# Patient Record
Sex: Male | Born: 1980 | Race: White | Hispanic: No | Marital: Married | Smoking: Former smoker
Health system: Southern US, Community
[De-identification: ages and names within clinical notes are randomized; demographics above are authoritative.]

## PROBLEM LIST (undated history)

## (undated) DIAGNOSIS — Z87442 Personal history of urinary calculi: Secondary | ICD-10-CM

## (undated) DIAGNOSIS — E119 Type 2 diabetes mellitus without complications: Secondary | ICD-10-CM

## (undated) HISTORY — DX: Personal history of urinary calculi: Z87.442

## (undated) HISTORY — PX: OTHER SURGICAL HISTORY: SHX169

---

## 2006-12-25 ENCOUNTER — Ambulatory Visit: Payer: Self-pay | Admitting: Orthopaedic Surgery

## 2015-09-28 ENCOUNTER — Ambulatory Visit
Admission: RE | Admit: 2015-09-28 | Discharge: 2015-09-28 | Disposition: A | Discharge status: Home / Self Care | Payer: 59 | Source: Ambulatory Visit | Attending: Urology | Admitting: Urology

## 2015-09-28 ENCOUNTER — Ambulatory Visit (INDEPENDENT_AMBULATORY_CARE_PROVIDER_SITE_OTHER): Payer: 59 | Admitting: Urology

## 2015-09-28 ENCOUNTER — Encounter: Payer: Self-pay | Admitting: Urology

## 2015-09-28 ENCOUNTER — Ambulatory Visit (INDEPENDENT_AMBULATORY_CARE_PROVIDER_SITE_OTHER)
Admit: 2015-09-28 | Discharge: 2015-09-28 | Disposition: A | Discharge status: Home / Self Care | Payer: 59 | Attending: Family Medicine | Admitting: Family Medicine

## 2015-09-28 ENCOUNTER — Encounter: Payer: Self-pay | Admitting: *Deleted

## 2015-09-28 ENCOUNTER — Ambulatory Visit
Admission: EM | Admit: 2015-09-28 | Discharge: 2015-09-28 | Disposition: A | Discharge status: Home / Self Care | Payer: 59 | Attending: Family Medicine | Admitting: Family Medicine

## 2015-09-28 VITALS — BP 142/82 | HR 85 | Ht 73.0 in | Wt 333.0 lb

## 2015-09-28 DIAGNOSIS — J302 Other seasonal allergic rhinitis: Secondary | ICD-10-CM | POA: Insufficient documentation

## 2015-09-28 DIAGNOSIS — N1330 Unspecified hydronephrosis: Secondary | ICD-10-CM

## 2015-09-28 DIAGNOSIS — N132 Hydronephrosis with renal and ureteral calculous obstruction: Secondary | ICD-10-CM | POA: Diagnosis not present

## 2015-09-28 DIAGNOSIS — N200 Calculus of kidney: Secondary | ICD-10-CM | POA: Diagnosis not present

## 2015-09-28 DIAGNOSIS — E669 Obesity, unspecified: Secondary | ICD-10-CM | POA: Insufficient documentation

## 2015-09-28 DIAGNOSIS — R3129 Other microscopic hematuria: Secondary | ICD-10-CM

## 2015-09-28 DIAGNOSIS — N201 Calculus of ureter: Secondary | ICD-10-CM | POA: Diagnosis not present

## 2015-09-28 LAB — URINALYSIS COMPLETE WITH MICROSCOPIC (ARMC ONLY)
BILIRUBIN URINE: NEGATIVE
Bacteria, UA: NONE SEEN
GLUCOSE, UA: NEGATIVE mg/dL
LEUKOCYTES UA: NEGATIVE
NITRITE: NEGATIVE
PH: 6 (ref 5.0–8.0)
Protein, ur: 100 mg/dL — AB
SPECIFIC GRAVITY, URINE: 1.025 (ref 1.005–1.030)
WBC, UA: NONE SEEN WBC/hpf (ref 0–5)

## 2015-09-28 LAB — URINALYSIS, COMPLETE
Bilirubin, UA: NEGATIVE
Glucose, UA: NEGATIVE
Ketones, UA: NEGATIVE
NITRITE UA: NEGATIVE
PH UA: 5.5 (ref 5.0–7.5)
Specific Gravity, UA: 1.025 (ref 1.005–1.030)
UUROB: 0.2 mg/dL (ref 0.2–1.0)

## 2015-09-28 LAB — MICROSCOPIC EXAMINATION: Bacteria, UA: NONE SEEN

## 2015-09-28 MED ORDER — KETOROLAC TROMETHAMINE 60 MG/2ML IM SOLN
60.0000 mg | Freq: Once | INTRAMUSCULAR | Status: AC
  Administered 2015-09-28: 60 mg via INTRAMUSCULAR

## 2015-09-28 MED ORDER — ONDANSETRON 8 MG PO TBDP: 8.0000 mg | ORAL_TABLET | Freq: Three times a day (TID) | ORAL | Status: DC | PRN

## 2015-09-28 MED ORDER — KETOROLAC TROMETHAMINE 10 MG PO TABS: 10.0000 mg | ORAL_TABLET | Freq: Four times a day (QID) | ORAL | Status: AC | PRN

## 2015-09-28 MED ORDER — NAPROXEN 500 MG PO TABS
500.0000 mg | ORAL_TABLET | Freq: Two times a day (BID) | ORAL | Status: AC
Start: 2015-09-28 — End: ?

## 2015-09-28 MED ORDER — ONDANSETRON 8 MG PO TBDP
8.0000 mg | ORAL_TABLET | Freq: Once | ORAL | Status: AC
  Administered 2015-09-28: 8 mg via ORAL

## 2015-09-28 MED ORDER — TAMSULOSIN HCL 0.4 MG PO CAPS: 0.4000 mg | ORAL_CAPSULE | Freq: Every day | ORAL | Status: DC

## 2015-09-28 MED ORDER — HYDROCODONE-ACETAMINOPHEN 5-325 MG PO TABS: 1.0000 | ORAL_TABLET | Freq: Three times a day (TID) | ORAL | Status: AC | PRN

## 2015-09-28 NOTE — Discharge Instructions (Signed)
Hydronephrosis °Hydronephrosis is the enlargement of a kidney due to a blockage that stops urine from flowing out of the body. °CAUSES °Common causes of this condition include: °· A birth (congenital) defect of the kidney. °· A congenital defect of the tube through which urine travels (ureter). °· Kidney stones. °· An enlarged prostate gland. °· A tumor. °· Cancer of the prostate, bladder, uterus, ovary, or colon. °· A blood clot. °SYMPTOMS °Symptoms of this condition include: °· Pain or discomfort in your side (flank). °· Swelling of the abdomen. °· Pain in the abdomen. °· Nausea and vomiting. °· Fever. °· Pain while passing urine. °· Feeling of urgency to urinate. °· Frequent urination. °· Infection of the urinary tract. °In some cases, there are no symptoms. °DIAGNOSIS °This condition Hukill be diagnosed with: °· A medical history. °· A physical exam. °· Blood and urine tests to check kidney function. °· Imaging tests, such as an X-ray, ultrasound, CT scan, or MRI. °· A test in which a rigid or flexible telescope (cystoscope) is used to view the site of the blockage. °TREATMENT °Treatment for this condition depends on where the blockage is located, how long it has been there, and what caused it. The goal of treatment is to remove the blockage. Treatment options include: °· A procedure to put in a soft tube to help drain urine. °· Antibiotic medicines to treat or prevent infection. °· Shock-wave therapy (lithotripsy) to help eliminate kidney stones. °HOME CARE INSTRUCTIONS °· Get lots of rest. °· Drink enough fluid to keep your urine clear or pale yellow. °· If you have a drain in, follow your health care provider's instructions about how to care for it. °· Take medicines only as directed by your health care provider. °· If you were prescribed an antibiotic medicine, finish all of it even if you start to feel better. °· Keep all follow-up visits as directed by your health care provider. This is important. °SEEK  MEDICAL CARE IF: °· You continue to have symptoms after treatment. °· You develop new symptoms. °· You have a problem with a drainage device. °· Your urine becomes cloudy or bloody. °· You have a fever. °SEEK IMMEDIATE MEDICAL CARE IF: °· You have severe flank or abdominal pain. °· You develop vomiting and are unable to keep fluids down. °  °This information is not intended to replace advice given to you by your health care provider. Make sure you discuss any questions you have with your health care provider. °  °Document Released: 03/19/2007 Document Revised: 10/06/2014 Document Reviewed: 05/18/2014 °Elsevier Interactive Patient Education ©2016 [employer] ° °Kidney Stones °Kidney stones (urolithiasis) are solid masses that form inside your kidneys. The intense pain is caused by the stone moving through the kidney, ureter, bladder, and urethra (urinary tract). When the stone moves, the ureter starts to spasm around the stone. The stone is usually passed in your pee (urine).  °HOME CARE °· Drink enough fluids to keep your pee clear or pale yellow. This helps to get the stone out. °· Take a 24-hour pee (urine) sample as told by your doctor. You [place] need to take another sample every 6-12 months. °· Strain all pee through the provided strainer. Do not pee without peeing through the strainer, not even once. If you pee the stone out, catch it in the strainer. The stone Sc[place] be as small as a grain of salt. Take this to your doctor. This will help your doctor figure out what you can do   to try to prevent more kidney stones. °· Only take medicine as told by your doctor. °· Make changes to your daily diet as told by your doctor. You [place] be told to: °¨ Limit how much salt you eat. °¨ Eat 5 or more servings of fruits and vegetables each day. °¨ Limit how much meat, poultry, fish, and eggs you eat. °· Keep all follow-up visits as told by your doctor. This is important. °· Get follow-up X-rays as told by your doctor. °GET HELP  IF: °You have pain that gets worse even if you have been taking pain medicine. °GET HELP RIGHT AWAY IF:  °· Your pain does not get better with medicine. °· You have a fever or shaking chills. °· Your pain increases and gets worse over 18 hours. °· You have new belly (abdominal) pain. °· You feel faint or pass out. °· You are unable to pee. °  °This information is not intended to replace advice given to you by your health care provider. Make sure you discuss any questions you have with your health care provider. °  °Document Released: 11/08/2007 Document Revised: 02/10/2015 Document Reviewed: 10/23/2012 °Elsevier Interactive Patient Education ©2016 Elsevier Inc. ° °

## 2015-09-28 NOTE — Progress Notes (Signed)
09/28/2015 2:16 PM   Joshua Fisher February 06, 1981 [id number]  Referring provider: No referring provider defined for this encounter.  Chief Complaint  Patient presents with  . Nephrolithiasis    New Patient    HPI: Patient is a 35 year old Caucasian male who presents today requesting an urgent office visit for a left ureteral stone.  Patient states that's over the weekend he had the sudden onset of left-sided flank pain associated with vomiting. The pain was located in nature.  He has had stones in the past, so he spent the evening trying to "walk it off."  He finally drifted off to sleep and the next day the pain was a constant dull ache.  Then that evening, the pain increased in the vomiting returned.  He then spent that evening trying to "walk it off."  The next day, he had no pain and felt that he [place] have passed the stone into the bladder. But, the pain began later that afternoon and he was evaluated through [facility] urgent care.  They obtained a CT renal stone study and it noted bilateral nephrolithiasis with mild left hydronephrosis due to a 6 mm calculus in the midportion of the left ureter. I personally reviewed the films with the patient.  He was given IV Toradol and the pain was controlled.  He was then discharged with Vicodin, tamsulosin and Zofran.  This morning, the pain returned again and he presented to our office for further evaluation and management.  He is not experiencing fevers, chills or vomiting at this time. He says slight nausea. He is had a dark-colored urine, but denies gross hematuria or dysuria.  He is still experiencing flank pain though it is mild at this time.   He does have a prior history of nephrolithiasis.  He has passed stones spontaneously in the past.  Stone composition is unknown at this time.    PMH: Past Medical History  Diagnosis Date  . History of kidney stones     Surgical History: Past Surgical History  Procedure Laterality Date  . None        Home Medications:    Medication List       This list is accurate as of: 09/28/15  2:16 PM.  Always use your most recent med list.               HYDROcodone-acetaminophen 5-325 MG tablet  Commonly known as:  NORCO  Take 1 tablet by mouth every 8 (eight) hours as needed for moderate pain.     naproxen 500 MG tablet  Commonly known as:  NAPROSYN  Take 1 tablet (500 mg total) by mouth 2 (two) times daily.     ondansetron 8 MG disintegrating tablet  Commonly known as:  ZOFRAN ODT  Take 1 tablet (8 mg total) by mouth every 8 (eight) hours as needed for nausea or vomiting.     tamsulosin 0.4 MG Caps capsule  Commonly known as:  FLOMAX  Take 1 capsule (0.4 mg total) by mouth daily.        Allergies: No Known Allergies  Family History: Family History  Problem Relation Age of Onset  . Bladder Cancer Neg Hx   . Kidney cancer Neg Hx   . Prostate cancer Neg Hx     Social History:  reports that he has quit smoking. He does not have any smokeless tobacco history on file. He reports that he does not drink alcohol or use illicit drugs.  ROS: UROLOGY  Frequent Urination?: No Hard to postpone urination?: No Burning/pain with urination?: No Get up at night to urinate?: No Leakage of urine?: No Urine stream starts and stops?: No Trouble starting stream?: No Do you have to strain to urinate?: No Blood in urine?: No Urinary tract infection?: No Sexually transmitted disease?: No Injury to kidneys or bladder?: No Painful intercourse?: No Weak stream?: No Erection problems?: No Penile pain?: No  Gastrointestinal Nausea?: Yes Vomiting?: Yes Indigestion/heartburn?: No Diarrhea?: No Constipation?: No  Constitutional Fever: No Night sweats?: No Weight loss?: No Fatigue?: No  Skin Skin rash/lesions?: No Itching?: No  Eyes Blurred vision?: No Double vision?: No  Ears/Nose/Throat Sore throat?: No Sinus problems?: No  Hematologic/Lymphatic Swollen glands?:  No Easy bruising?: No  Cardiovascular Leg swelling?: No Chest pain?: No  Respiratory Cough?: No Shortness of breath?: No  Endocrine Excessive thirst?: No  Musculoskeletal Back pain?: No Joint pain?: No  Neurological Headaches?: No Dizziness?: No  Psychologic Depression?: No Anxiety?: No  Physical Exam: BP 142/82 mmHg  Pulse 85  Ht _0  (1.854 m)  Wt 333 lb (151.048 kg)  BMI 43.94 kg/m2  Constitutional: Well nourished. Alert and oriented, No acute distress. HEENT: [place] AT, moist mucus membranes. Trachea midline, no masses. Cardiovascular: No clubbing, cyanosis, or edema. Respiratory: Normal respiratory effort, no increased work of breathing. GI: Abdomen is soft, non tender, non distended, no abdominal masses. Liver and spleen not palpable.  No hernias appreciated.  Stool sample for occult testing is not indicated.   GU: No CVA tenderness.  No bladder fullness or masses.  Patient with uncircumcised phallus.  Urethral meatus is patent.  No penile discharge. No penile lesions or rashes. Scrotum without lesions, cysts, rashes and/or edema.  Testicles are located scrotally bilaterally. No masses are appreciated in the testicles. Left and right epididymis are normal. Rectal: Deferred  Skin: No rashes, bruises or suspicious lesions. Lymph: No cervical or inguinal adenopathy. Neurologic: Grossly intact, no focal deficits, moving all 4 extremities. Psychiatric: Normal mood and affect.  Laboratory Data:  Urinalysis Results for orders placed or performed in visit on 09/28/15  Microscopic Examination  Result Value Ref Range   WBC, UA 0-5 0 -  5 /hpf   RBC, UA >30 (A) 0 -  2 /hpf   Epithelial Cells (non renal) 0-10 0 - 10 /hpf   Mucus, UA Present (A) Not Estab.   Bacteria, UA None seen None seen/Few  Urinalysis, Complete  Result Value Ref Range   Specific Gravity, UA 1.025 1.005 - 1.030   pH, UA 5.5 5.0 - 7.5   Color, UA Yellow Yellow   Appearance Ur Cloudy (A) Clear    Leukocytes, UA Trace (A) Negative   Protein, UA 2+ (A) Negative/Trace   Glucose, UA Negative Negative   Ketones, UA Negative Negative   RBC, UA 3+ (A) Negative   Bilirubin, UA Negative Negative   Urobilinogen, Ur 0.2 0.2 - 1.0 mg/dL   Nitrite, UA Negative Negative   Microscopic Examination See below:     Pertinent Imaging: CLINICAL DATA: Acute left flank pain.  EXAM: CT ABDOMEN AND PELVIS WITHOUT CONTRAST  TECHNIQUE: Multidetector CT imaging of the abdomen and pelvis was performed following the standard protocol without IV contrast.  COMPARISON: None.  FINDINGS: Visualized lung bases are unremarkable. No significant osseous abnormality is noted.  No gallstones are noted. No focal abnormality is noted in the liver, spleen or pancreas on these unenhanced images. Adrenal glands are unremarkable. Bilateral nephrolithiasis is noted. Right ureter  appears normal. Mild left hydronephrosis is noted secondary to 6 mm calculus in midportion of left ureter. The appendix appears normal. There is no evidence of bowel obstruction. No abnormal fluid collection is noted. Urinary bladder is nondistended. No significant adenopathy is noted.  IMPRESSION: Bilateral nephrolithiasis. Mild left hydronephrosis is noted secondary to 6 mm calculus in midportion of left ureter.   Electronically Signed  By: Marijo Conception, M.D.  On: 09/28/2015 10:11   Assessment & Plan:    1. Left ureteral stone:   I discussed with the patient MET, ESWL and URS//ureteral stent placement for definitive treatment for the left ureteral stone.  He is mostly interested in ESWL.  I did explain to him due to his skin to stone ratio that ESWL would most likely not be that effective for him.  This would result in further procedures to rid himself of the stone burden.  Undergoing ESWL would not completely preclude the possibility of going to the OR to have the stone removed.  I also stated that if the stone  was not visible on KUB, ESWL would not be an option.  He will have his KUB today and he would like to discuss this further with his wife.  A prescription for Toradol is sent to his pharmacy.    2. Left hydronephrosis:    Patient was found to have left hydronephrosis due to an left ureteral stone.  A RUS will be obtained one month after he has underwent definitive treatment for the ureteral stone to ensure the hydronephrosis has resolved.    3. Microscopic hematuria:   We will continue to monitor the patient's UA after the treatment/passage of the stone to ensure the hematuria has resolved.  If hematuria persists, we will pursue a hematuria workup with CT Urogram and cystoscopy if appropriate.   4. Bilateral nephrolithiasis:   Patient with bilateral nephrolithiasis.  He will continue to monitor.  He Iyengar wish to undergo definitive treatment for remaining stones in the future.  Return for Patient will call with decision.  These notes generated with voice recognition software. I apologize for typographical errors.  Zara Council, [facility] [address] [place], [place] [address] [phone]

## 2015-09-28 NOTE — ED Provider Notes (Signed)
CSN: [id number]     Arrival date & time 09/28/15  0805 History   First MD Initiated Contact with Patient 09/28/15 [phone]    Nurses notes were reviewed.  Chief Complaint  Patient presents with  . Flank Pain   Patient's here because of flank pain. Flank pain started on Saturday he was opened he can pass the stone he has passed about 3 stones before. He is always been able to pass a stone before but this seems that she can pass this one. He states that since Saturday he is pain and blood in his urine. Other than kidney stones no other medical problems states he had. He is a former smoker.  States no pertinent family medical history to this visit. He has had nausea and vomiting as well with the flank pain.       (Consider location/radiation/quality/duration/timing/severity/associated sxs/prior Treatment) Patient is a 35 y.o. male presenting with flank pain. The history is provided by the patient. No language interpreter was used.  Flank Pain This is a new problem. The current episode started more than 2 days ago. The problem occurs constantly. The problem has been gradually worsening. Associated symptoms include abdominal pain. Pertinent negatives include no chest pain and no shortness of breath. The symptoms are aggravated by walking. Nothing relieves the symptoms. He has tried acetaminophen for the symptoms. The treatment provided no relief.    History reviewed. No pertinent past medical history. History reviewed. No pertinent past surgical history. History reviewed. No pertinent family history. Social History  Substance Use Topics  . Smoking status: Former Games developer  . Smokeless tobacco: None  . Alcohol Use: No    Review of Systems  Respiratory: Negative for shortness of breath.   Cardiovascular: Negative for chest pain.  Gastrointestinal: Positive for nausea and abdominal pain.  Genitourinary: Positive for flank pain.  All other systems reviewed and are negative.   Allergies   Review of patient's allergies indicates no known allergies.  Home Medications   Prior to Admission medications   Medication Sig Start Date End Date Taking? Authorizing Provider  HYDROcodone-acetaminophen (NORCO) 5-325 MG tablet Take 1 tablet by mouth every 8 (eight) hours as needed for moderate pain. 09/28/15   Hassan Rowan, MD  naproxen (NAPROSYN) 500 MG tablet Take 1 tablet (500 mg total) by mouth 2 (two) times daily. 09/28/15   Hassan Rowan, MD  ondansetron (ZOFRAN ODT) 8 MG disintegrating tablet Take 1 tablet (8 mg total) by mouth every 8 (eight) hours as needed for nausea or vomiting. 09/28/15   Hassan Rowan, MD  tamsulosin (FLOMAX) 0.4 MG CAPS capsule Take 1 capsule (0.4 mg total) by mouth daily. 09/28/15   Hassan Rowan, MD   Meds Ordered and Administered this Visit   Medications  ketorolac (TORADOL) injection 60 mg (60 mg Intramuscular Given 09/28/15 0829)  ondansetron (ZOFRAN-ODT) disintegrating tablet 8 mg (8 mg Oral Given 09/28/15 0902)    BP 157/105 mmHg  Pulse 70  Temp(Src) 97.9 F (36.6 C) (Oral)  Resp 16  Ht  (1.854 m)  Wt 328 lb (148.78 kg)  BMI 43.28 kg/m2  SpO2 100% No data found.   Physical Exam  Constitutional: He is oriented to person, place, and time. He appears well-developed and well-nourished.  HENT:  Head: Normocephalic.  Eyes: Conjunctivae are normal. Pupils are equal, round, and reactive to light.  Neck: Normal range of motion. Neck supple.  Cardiovascular: Normal rate, regular rhythm and normal heart sounds.   Pulmonary/Chest: Effort normal and breath  sounds normal.  Abdominal: Soft. There is no tenderness.  Musculoskeletal: Normal range of motion. He exhibits no tenderness.  Neurological: He is alert and oriented to person, place, and time.  Skin: Skin is warm and dry.  Psychiatric: He has a normal mood and affect. His behavior is normal.  Vitals reviewed.   ED Course  Procedures (including critical care time)  Labs Review Labs Reviewed   URINALYSIS COMPLETEWITH MICROSCOPIC ([facility] ONLY) - Abnormal; Notable for the following:    Ketones, ur TRACE (*)    Hgb urine dipstick 1+ (*)    Protein, ur 100 (*)    Squamous Epithelial / LPF 0-5 (*)    All other components within normal limits    Imaging Review Ct Renal Stone Study  09/28/2015  CLINICAL DATA:  Acute left flank pain. EXAM: CT ABDOMEN AND PELVIS WITHOUT CONTRAST TECHNIQUE: Multidetector CT imaging of the abdomen and pelvis was performed following the standard protocol without IV contrast. COMPARISON:  None. FINDINGS: Visualized lung bases are unremarkable. No significant osseous abnormality is noted. No gallstones are noted. No focal abnormality is noted in the liver, spleen or pancreas on these unenhanced images. Adrenal glands are unremarkable. Bilateral nephrolithiasis is noted. Right ureter appears normal. Mild left hydronephrosis is noted secondary to 6 mm calculus in midportion of left ureter. The appendix appears normal. There is no evidence of bowel obstruction. No abnormal fluid collection is noted. Urinary bladder is nondistended. No significant adenopathy is noted. IMPRESSION: Bilateral nephrolithiasis. Mild left hydronephrosis is noted secondary to 6 mm calculus in midportion of left ureter. Electronically Signed   By: Lupita RaiderJames  Green Jr, M.D.   On: 09/28/2015 10:11     Visual Acuity Review  Right Eye Distance:   Left Eye Distance:   Bilateral Distance:    Right Eye Near:   Left Eye Near:    Bilateral Near:        Results for orders placed or performed during the hospital encounter of 09/28/15  Urinalysis complete, with microscopic  Result Value Ref Range   Color, Urine YELLOW YELLOW   APPearance CLEAR CLEAR   Glucose, UA NEGATIVE NEGATIVE mg/dL   Bilirubin Urine NEGATIVE NEGATIVE   Ketones, ur TRACE (A) NEGATIVE mg/dL   Specific Gravity, Urine 1.025 1.005 - 1.030   Hgb urine dipstick 1+ (A) NEGATIVE   pH 6.0 5.0 - 8.0   Protein, ur 100 (A) NEGATIVE  mg/dL   Nitrite NEGATIVE NEGATIVE   Leukocytes, UA NEGATIVE NEGATIVE   RBC / HPF 6-30 0 - 5 RBC/hpf   WBC, UA NONE SEEN 0 - 5 WBC/hpf   Bacteria, UA NONE SEEN NONE SEEN   Squamous Epithelial / LPF 0-5 (A) NONE SEEN   Hyaline Casts, UA PRESENT     MDM   1. Kidney stone on left side   2. Hydronephrosis of left kidney     Patient was informed he has hydronephrosis of the left kidney and his kidney stone on the left side 6 mm. We have made arrangements for him to to be seen at urology office today. He asked for type stone he has explained to him I could not tell him until he passes a stone he also has expressed concern because the appointment at the urologist office today is for a new patient and not to have the stone removed. Explained to him that the urologist will make the determination when the stone needs to be removed and the lithotripsy is sometimes something they'll wait for  at least a week before they do and even though he does have hydronephrosis the neurologist said that the hydronephrosis is mild indicating that blocks the kidney has not been real bad at this time.  Work note written for today and tomorrow. He was given Toradol 60 mg IM with improvement of his pain Zofran for nausea and we also give him normal Naprosyn for pain per their suggestion hydrocodone for pain and Flomax. And Zofran prescription   Note: This dictation was prepared with Dragon dictation along with smaller phrase technology. Any transcriptional errors that result from this process are unintentional.   Hassan Rowan, MD 09/28/15 1136

## 2015-09-28 NOTE — ED Notes (Signed)
Left flank pain since Sat. Hx of kidney stones, states similar pain.

## 2015-09-29 DIAGNOSIS — N200 Calculus of kidney: Secondary | ICD-10-CM | POA: Insufficient documentation

## 2015-09-29 DIAGNOSIS — N132 Hydronephrosis with renal and ureteral calculous obstruction: Secondary | ICD-10-CM | POA: Insufficient documentation

## 2015-09-29 DIAGNOSIS — R3129 Other microscopic hematuria: Secondary | ICD-10-CM | POA: Insufficient documentation

## 2015-09-29 DIAGNOSIS — N201 Calculus of ureter: Secondary | ICD-10-CM | POA: Insufficient documentation

## 2015-09-30 LAB — CULTURE, URINE COMPREHENSIVE

## 2017-06-29 IMAGING — CT CT RENAL STONE PROTOCOL
2 of 4 series · 17 of 46 positions shown, 19 images · non-contrast
Comparison: None.

CLINICAL DATA: Acute left flank pain.

EXAM:
CT ABDOMEN AND PELVIS WITHOUT CONTRAST
TECHNIQUE: Multidetector CT imaging of the abdomen and pelvis was performed
following the standard protocol without IV contrast.

[Series 2: soft tissue · axial · 0.82mm/px · z∈[-855,-390]mm · 14 of 103 slices shown, 16 images]
[im 5/103  soft-tissue]
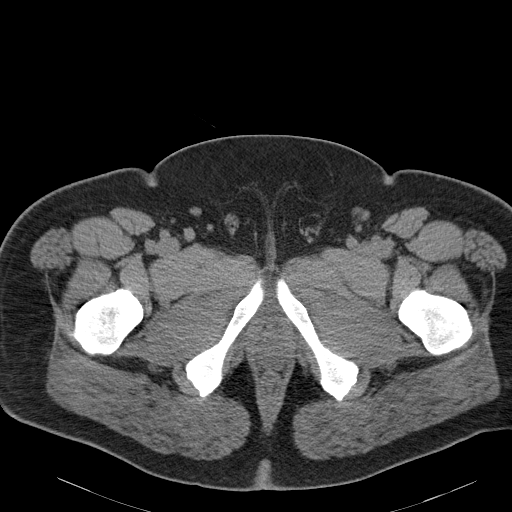
[im 5/103  bone]
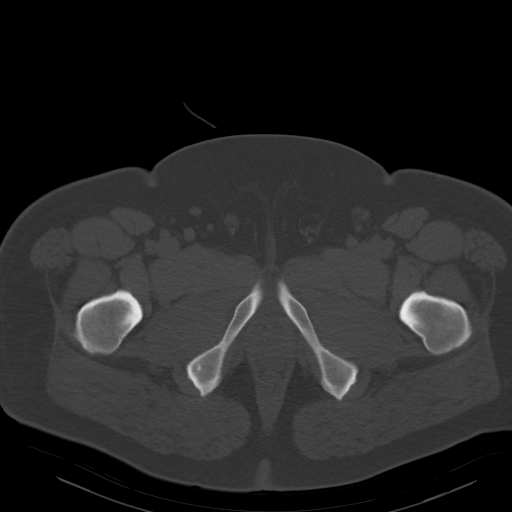
[im 14/103  soft-tissue]
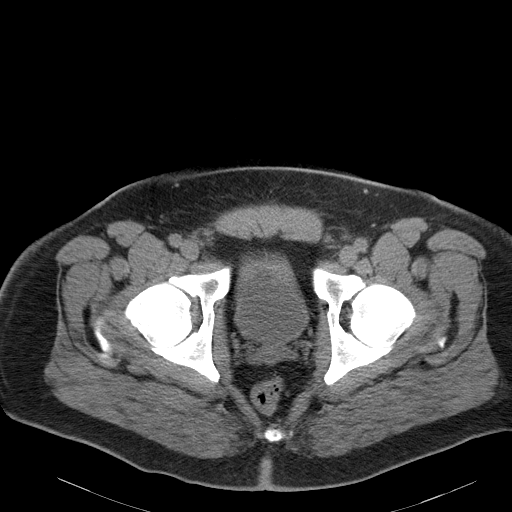
[im 18/103  soft-tissue]
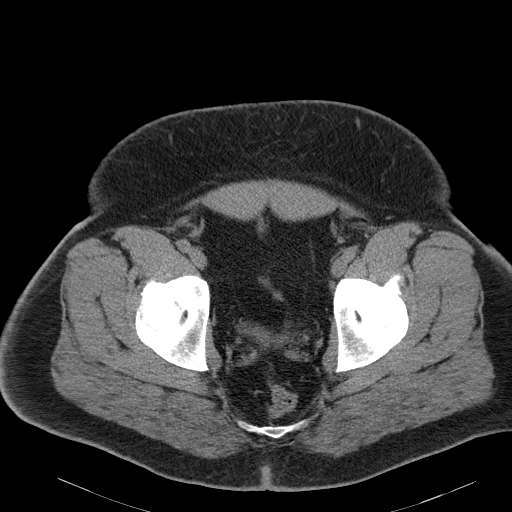
[im 27/103  soft-tissue]
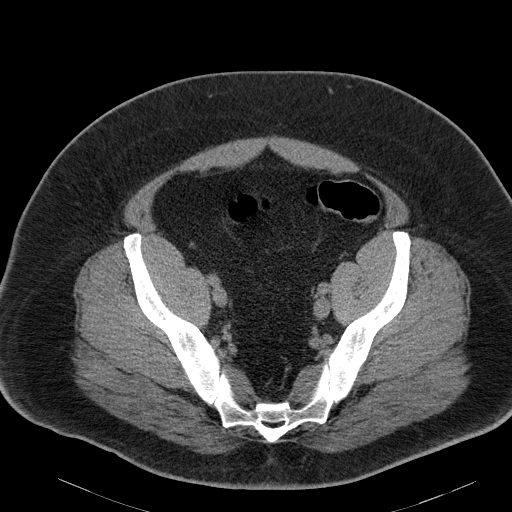
[im 36/103  soft-tissue]
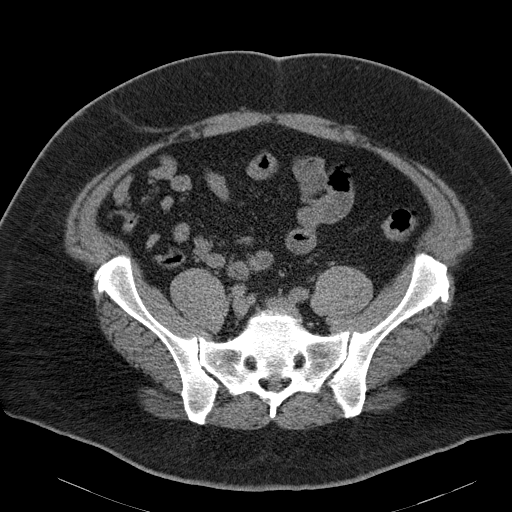
[im 40/103  soft-tissue]
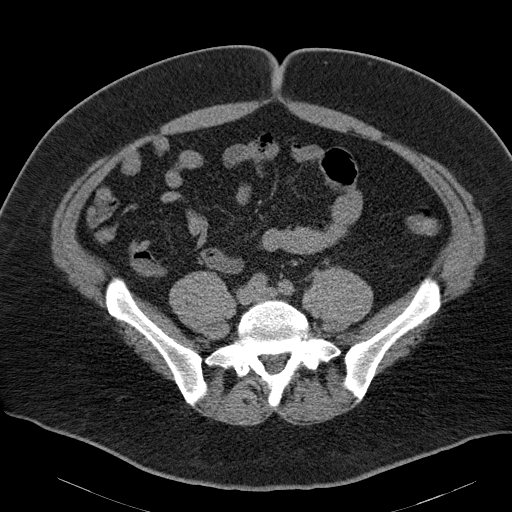
[im 49/103  soft-tissue]
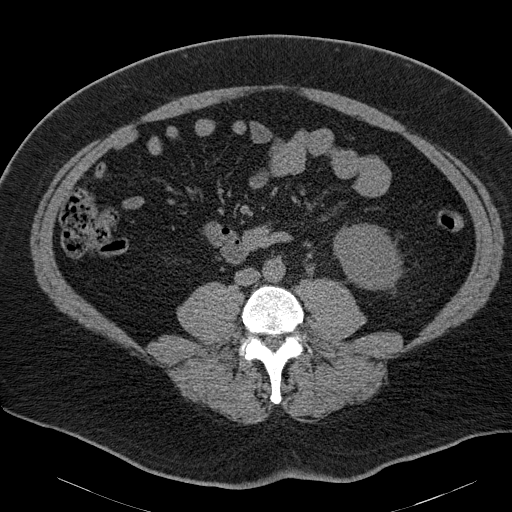
[im 54/103  soft-tissue]
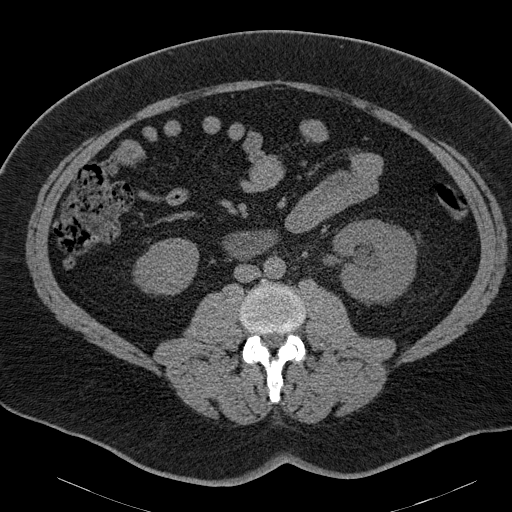
[im 63/103  soft-tissue]
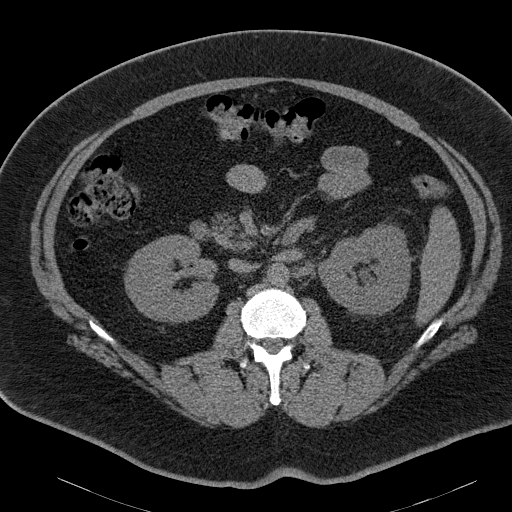
[im 63/103  bone]
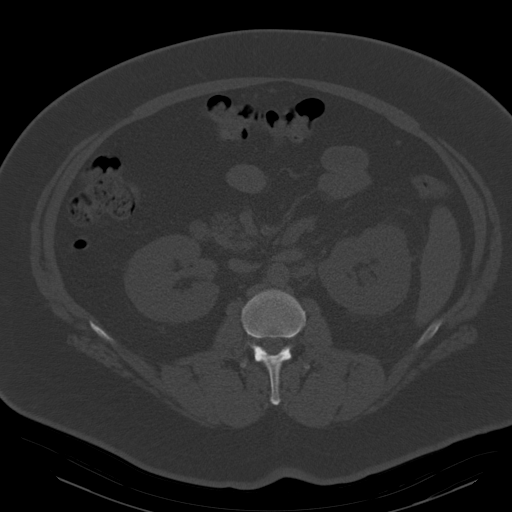
[im 67/103  soft-tissue]
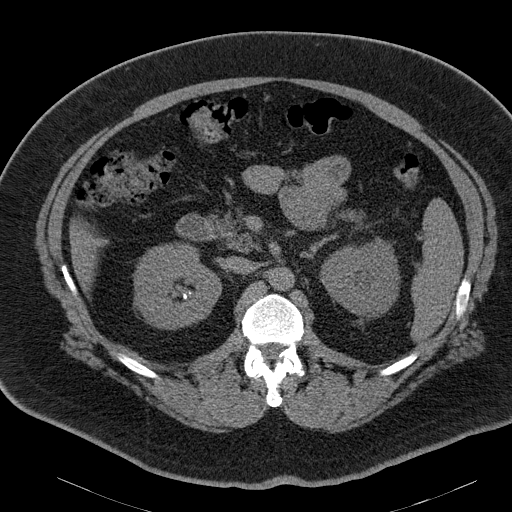
[im 76/103  soft-tissue]
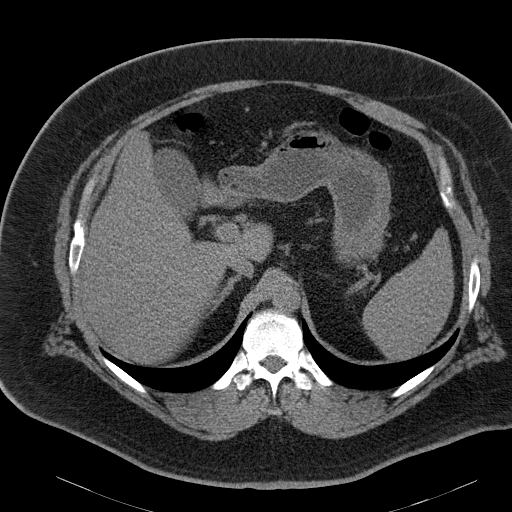
[im 85/103  soft-tissue]
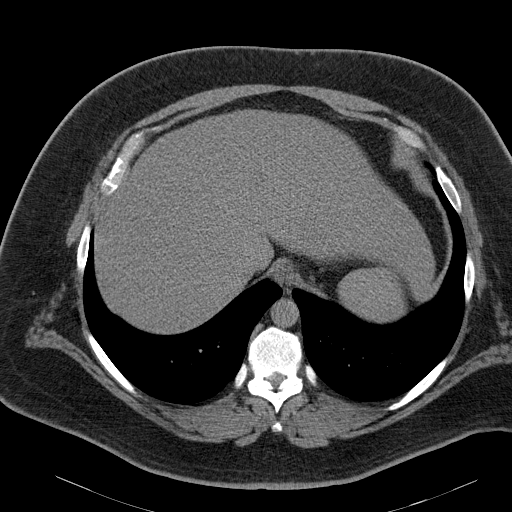
[im 89/103  soft-tissue]
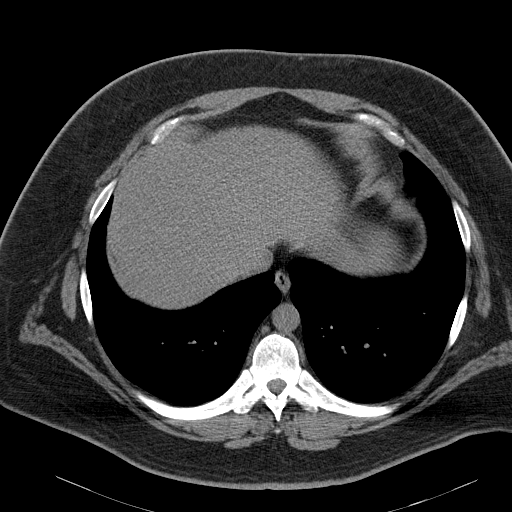
[im 98/103  soft-tissue]
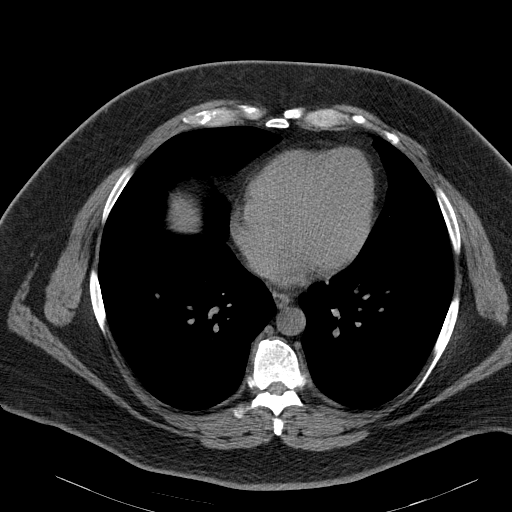

[Series 602: coronal · coronal · 1.01mm/px · 3 of 148 slices shown]
[im 50/148  soft-tissue]
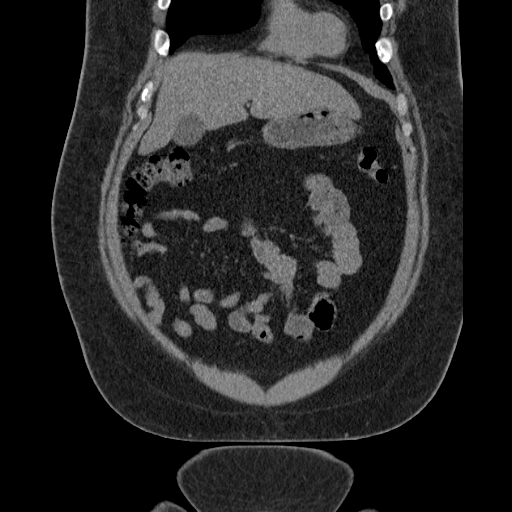
[im 66/148  soft-tissue]
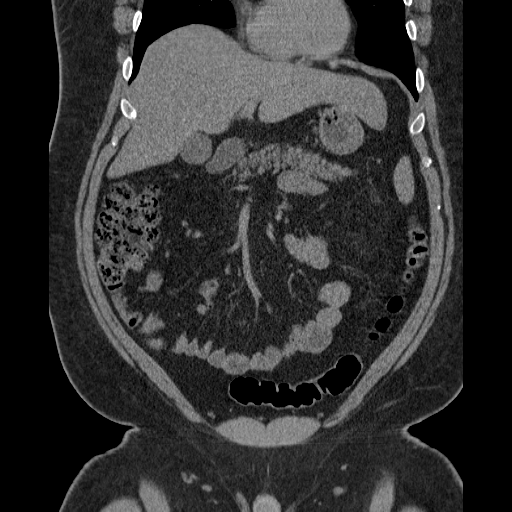
[im 82/148  soft-tissue]
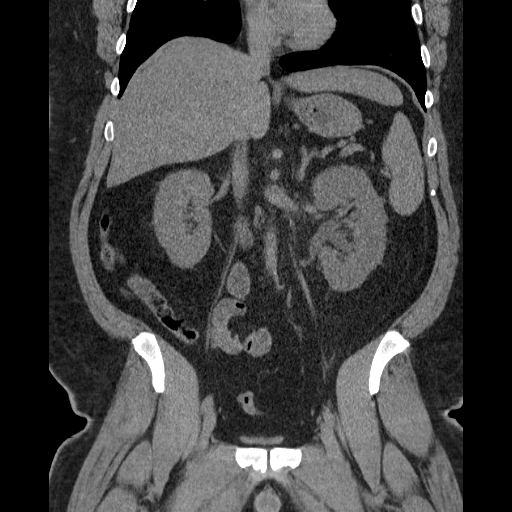

[17 of 46 positions shown; findings below may reference images not displayed]

FINDINGS: Visualized lung bases are unremarkable. No significant osseous
abnormality is noted.

No gallstones are noted. No focal abnormality is noted in the liver,
spleen or pancreas on these unenhanced images. Adrenal glands are
unremarkable. Bilateral nephrolithiasis is noted. Right ureter
appears normal. Mild left hydronephrosis is noted secondary to 6 mm
calculus in midportion of left ureter. The appendix appears normal.
There is no evidence of bowel obstruction. No abnormal fluid
collection is noted. Urinary bladder is nondistended. No significant
adenopathy is noted.
IMPRESSION: Bilateral nephrolithiasis. Mild left hydronephrosis is noted
secondary to 6 mm calculus in midportion of left ureter.

## 2020-08-30 ENCOUNTER — Other Ambulatory Visit: Payer: Self-pay | Admitting: Internal Medicine

## 2020-08-30 ENCOUNTER — Other Ambulatory Visit (HOSPITAL_COMMUNITY): Payer: Self-pay | Admitting: Internal Medicine

## 2020-08-30 DIAGNOSIS — R1084 Generalized abdominal pain: Secondary | ICD-10-CM

## 2020-09-14 ENCOUNTER — Ambulatory Visit: Payer: 59 | Admitting: Urology

## 2020-09-15 ENCOUNTER — Ambulatory Visit: Payer: Self-pay

## 2023-09-15 ENCOUNTER — Other Ambulatory Visit: Payer: Self-pay

## 2023-09-15 ENCOUNTER — Emergency Department
Admission: EM | Admit: 2023-09-15 | Discharge: 2023-09-15 | Disposition: A | Discharge status: Home / Self Care | Attending: Emergency Medicine | Admitting: Emergency Medicine

## 2023-09-15 ENCOUNTER — Emergency Department

## 2023-09-15 DIAGNOSIS — I10 Essential (primary) hypertension: Secondary | ICD-10-CM | POA: Diagnosis not present

## 2023-09-15 DIAGNOSIS — E871 Hypo-osmolality and hyponatremia: Secondary | ICD-10-CM | POA: Insufficient documentation

## 2023-09-15 DIAGNOSIS — N200 Calculus of kidney: Secondary | ICD-10-CM

## 2023-09-15 DIAGNOSIS — D72829 Elevated white blood cell count, unspecified: Secondary | ICD-10-CM | POA: Diagnosis not present

## 2023-09-15 DIAGNOSIS — E1165 Type 2 diabetes mellitus with hyperglycemia: Secondary | ICD-10-CM | POA: Diagnosis not present

## 2023-09-15 DIAGNOSIS — R109 Unspecified abdominal pain: Secondary | ICD-10-CM | POA: Diagnosis present

## 2023-09-15 DIAGNOSIS — N132 Hydronephrosis with renal and ureteral calculous obstruction: Secondary | ICD-10-CM | POA: Insufficient documentation

## 2023-09-15 HISTORY — DX: Type 2 diabetes mellitus without complications: E11.9

## 2023-09-15 LAB — BASIC METABOLIC PANEL WITH GFR
Anion gap: 11 (ref 5–15)
BUN: 17 mg/dL (ref 6–20)
CO2: 23 mmol/L (ref 22–32)
Calcium: 9.4 mg/dL (ref 8.9–10.3)
Chloride: 95 mmol/L — ABNORMAL LOW (ref 98–111)
Creatinine, Ser: 0.97 mg/dL (ref 0.61–1.24)
GFR, Estimated: >60 mL/min (ref >60)
Glucose, Bld: 266 mg/dL — ABNORMAL HIGH (ref 70–99)
Potassium: 4.3 mmol/L (ref 3.5–5.1)
Sodium: 129 mmol/L — ABNORMAL LOW (ref 135–145)

## 2023-09-15 LAB — URINALYSIS, W/ REFLEX TO CULTURE (INFECTION SUSPECTED)
Bacteria, UA: NONE SEEN
Bilirubin Urine: NEGATIVE
Glucose, UA: >=500 mg/dL — AB
Hgb urine dipstick: NEGATIVE
Ketones, ur: 5 mg/dL — AB
Leukocytes,Ua: NEGATIVE
Nitrite: NEGATIVE
Protein, ur: 100 mg/dL — AB
Specific Gravity, Urine: 1.015 (ref 1.005–1.030)
Squamous Epithelial / HPF: 0 /HPF (ref 0–5)
pH: 5 (ref 5.0–8.0)

## 2023-09-15 LAB — LIPASE, BLOOD: Lipase: 37 U/L (ref 11–51)

## 2023-09-15 LAB — HEPATIC FUNCTION PANEL
ALT: 53 U/L — ABNORMAL HIGH (ref 0–44)
AST: 25 U/L (ref 15–41)
Albumin: 4.1 g/dL (ref 3.5–5.0)
Alkaline Phosphatase: 64 U/L (ref 38–126)
Bilirubin, Direct: <0.1 mg/dL (ref 0.0–0.2)
Total Bilirubin: 0.7 mg/dL (ref 0.0–1.2)
Total Protein: 7.7 g/dL (ref 6.5–8.1)

## 2023-09-15 LAB — CBC
HCT: 42.4 % (ref 39.0–52.0)
Hemoglobin: 14.5 g/dL (ref 13.0–17.0)
MCH: 26.9 pg (ref 26.0–34.0)
MCHC: 34.2 g/dL (ref 30.0–36.0)
MCV: 78.7 fL — ABNORMAL LOW (ref 80.0–100.0)
Platelets: 205 10*3/uL (ref 150–400)
RBC: 5.39 MIL/uL (ref 4.22–5.81)
RDW: 13 % (ref 11.5–15.5)
WBC: 11.1 10*3/uL — ABNORMAL HIGH (ref 4.0–10.5)
nRBC: 0 % (ref 0.0–0.2)

## 2023-09-15 MED ORDER — SODIUM CHLORIDE 0.9 % IV BOLUS
1000.0000 mL | Freq: Once | INTRAVENOUS | Status: AC
  Administered 2023-09-15: 1000 mL via INTRAVENOUS

## 2023-09-15 MED ORDER — ONDANSETRON HCL 4 MG/2ML IJ SOLN
4.0000 mg | Freq: Once | INTRAMUSCULAR | Status: AC
  Administered 2023-09-15: 4 mg via INTRAVENOUS
  Filled 2023-09-15: qty 2

## 2023-09-15 MED ORDER — KETOROLAC TROMETHAMINE 15 MG/ML IJ SOLN
15.0000 mg | Freq: Once | INTRAMUSCULAR | Status: AC
  Administered 2023-09-15: 15 mg via INTRAVENOUS
  Filled 2023-09-15: qty 1

## 2023-09-15 MED ORDER — SODIUM CHLORIDE 0.9 % IV BOLUS
500.0000 mL | Freq: Once | INTRAVENOUS | Status: AC
  Administered 2023-09-15: 500 mL via INTRAVENOUS

## 2023-09-15 MED ORDER — ONDANSETRON 4 MG PO TBDP: 4.0000 mg | ORAL_TABLET | Freq: Three times a day (TID) | ORAL | 0 refills | Status: AC | PRN

## 2023-09-15 MED ORDER — TAMSULOSIN HCL 0.4 MG PO CAPS: 0.4000 mg | ORAL_CAPSULE | Freq: Every day | ORAL | 0 refills | Status: AC

## 2023-09-15 MED ORDER — HYDROMORPHONE HCL 1 MG/ML IJ SOLN
1.0000 mg | Freq: Once | INTRAMUSCULAR | Status: AC
  Administered 2023-09-15: 1 mg via INTRAVENOUS
  Filled 2023-09-15: qty 1

## 2023-09-15 MED ORDER — OXYCODONE-ACETAMINOPHEN 5-325 MG PO TABS: 1.0000 | ORAL_TABLET | ORAL | 0 refills | Status: AC | PRN

## 2023-09-15 MED ORDER — OXYCODONE-ACETAMINOPHEN 5-325 MG PO TABS
1.0000 | ORAL_TABLET | Freq: Once | ORAL | Status: AC
  Administered 2023-09-15: 1 via ORAL
  Filled 2023-09-15: qty 1

## 2023-09-15 NOTE — Discharge Instructions (Addendum)
You were seen in the emergency department and diagnosed with a kidney stone.  It is importantly follow-up closely with urology.  Return to the emergency department if you have worsening symptoms.  Pain control:  Ibuprofen (motrin/aleve/advil) - You can take 3-4 tablets (600-800 mg) every 6 hours as needed for pain/fever.  Acetaminophen (tylenol) - You can take 2 extra strength tablets (1000 mg) every 6 hours as needed for pain/fever.  You can alternate these medications or take them together.  Make sure you eat food/drink water when taking these medications.  If you are still hurting you can take an opioid pain medication, only take if you are in severe pain.  Percocet - you were given a prescription for narcotic pain medications.   These are very addictive medications.  These medications can make you constipated.  If you need to take more than 1-2 doses, start a stool softner.  If you become constipated, take 1-2 capfull of MiraLAX mixed in 16 oz of water, can repeat untill having regular bowel movements.  Keep this medication out of reach of any children.  Flomax (tamsulosin) -this medication will make you urinate more frequently, it is importantly stay hydrated with water while taking this medication  zofran (ondansetron) - nausea medication, take 1 tablet every 8 hours as needed for nausea/vomiting.

## 2023-09-15 NOTE — ED Provider Notes (Signed)
 [facility] Provider Note    Event Date/Time   First MD Initiated Contact with Patient 09/15/23 1241     (approximate)   History   Flank Pain   HPI  Joshua Fisher is a 43 y.o. male past medical history significant for hypertension, diabetes, who presents to the emergency department with left-sided abdominal pain.  Left-sided abdominal pain started yesterday morning.  Associated with multiple episodes of nausea and vomiting.  States that it feels similar to prior episodes of kidney stone.  States that he is never needed a procedure for kidney stone the last kidney stone was approximately 1 year ago.  Has not followed up as an outpatient with urology in the past couple of years.  Denies any falls or trauma.  Denies any diarrhea or change in stool.  No prior abdominal surgery.  Endorses chills and feels like he is sweating but believes that is from the pain.  Denies any fever.  States he does have decreased urine output.  Denies dysuria.  Endorses regular ibuprofen use.  Denies any significant alcohol use or marijuana use.     Physical Exam   Triage Vital Signs: ED Triage Vitals  Encounter Vitals Group     BP 09/15/23 1235 (!) 164/106     Systolic BP Percentile --      Diastolic BP Percentile --      Pulse Rate 09/15/23 1235 67     Resp 09/15/23 1235 20     Temp 09/15/23 1235 97.6 F (36.4 C)     Temp Source 09/15/23 1235 Oral     SpO2 09/15/23 1235 100 %     Weight 09/15/23 1233 300 lb (136.1 kg)     Height 09/15/23 1233 6\' 2"  (1.88 m)     Head Circumference --      Peak Flow --      Pain Score 09/15/23 1233 10     Pain Loc --      Pain Education --      Exclude from Growth Chart --     Most recent vital signs: Vitals:   09/15/23 1235  BP: (!) 164/106  Pulse: 67  Resp: 20  Temp: 97.6 F (36.4 C)  SpO2: 100%    Physical Exam Constitutional:      Appearance: He is well-developed.  HENT:     Head: Atraumatic.     Mouth/Throat:      Mouth: Mucous membranes are moist.  Eyes:     Extraocular Movements: Extraocular movements intact.     Conjunctiva/sclera: Conjunctivae normal.     Pupils: Pupils are equal, round, and reactive to light.  Cardiovascular:     Rate and Rhythm: Regular rhythm.  Pulmonary:     Effort: No respiratory distress.  Abdominal:     General: Abdomen is flat.     Tenderness: There is abdominal tenderness. There is left CVA tenderness. There is no right CVA tenderness, guarding or rebound.  Musculoskeletal:        General: Normal range of motion.     Cervical back: Normal range of motion.     Right lower leg: No edema.     Left lower leg: No edema.  Skin:    General: Skin is warm.     Capillary Refill: Capillary refill takes less than 2 seconds.  Neurological:     Mental Status: He is alert. Mental status is at baseline.     IMPRESSION / MDM / ASSESSMENT AND  PLAN / ED COURSE  I reviewed the triage vital signs and the nursing notes.  Differential diagnosis including kidney stone, gastritis/PUD, pyelonephritis, abscess, pancreatitis    No tachycardic or bradycardic dysrhythmias while on cardiac telemetry.  RADIOLOGY I independently reviewed imaging, my interpretation of imaging: CT scan stone study -left-sided hydronephrosis.  Read as obstructing kidney stone with hydronephrosis and a 6 mm stone to the proximal ureter.  LABS (all labs ordered are listed, but only abnormal results are displayed) Labs interpreted as -    Labs Reviewed  BASIC METABOLIC PANEL WITH GFR - Abnormal; Notable for the following components:      Result Value   Sodium 129 (*)    Chloride 95 (*)    Glucose, Bld 266 (*)    All other components within normal limits  CBC - Abnormal; Notable for the following components:   WBC 11.1 (*)    MCV 78.7 (*)    All other components within normal limits  HEPATIC FUNCTION PANEL - Abnormal; Notable for the following components:   ALT 53 (*)    All other components within  normal limits  URINALYSIS, W/ REFLEX TO CULTURE (INFECTION SUSPECTED) - Abnormal; Notable for the following components:   Color, Urine YELLOW (*)    APPearance CLEAR (*)    Glucose, UA >=500 (*)    Ketones, ur 5 (*)    Protein, ur 100 (*)    All other components within normal limits  LIPASE, BLOOD     MDM  Patient found to have hyperglycemia with a glucose of 266.  Hyponatremia with a sodium of 129 which is likely secondary to volume depletion in the setting of significant vomiting.  Mild leukocytosis.  Will add on a lipase and LFTs.   Plan for IV fluids, IV antiemetics and Toradol.  Will obtain a CT scan.  Plan to reevaluate after CT scan.  No signs of urinary tract infection.  Clinical Course as of 09/15/23 1533  Sat Sep 15, 2023  1441 Pain is much and has resolved.  Will attempt to urinate.  States that he is passed a 5 mm stone in the past.  Discussed at length the need to follow-up closely with urology.  Discussed return precautions for any signs of infection, worsening pain or fever.  Discussed close follow-up with primary care physician so they can recheck electrolytes specifically his sodium level.  Discussed staying hydrated and drink plenty of fluids.  No questions at time of discharge. [SM]    Clinical Course User Index [SM] Viviano Ground, MD     PROCEDURES:  Critical Care performed: No  Procedures  Patient's presentation is most consistent with acute presentation with potential threat to life or bodily function.   MEDICATIONS ORDERED IN ED: Medications  oxyCODONE-acetaminophen (PERCOCET/ROXICET) 5-325 MG per tablet 1 tablet (has no administration in time range)  sodium chloride 0.9 % bolus 500 mL (has no administration in time range)  sodium chloride 0.9 % bolus 1,000 mL (0 mLs Intravenous Stopped 09/15/23 1503)  ketorolac (TORADOL) 15 MG/ML injection 15 mg (15 mg Intravenous Given 09/15/23 1316)  ondansetron (ZOFRAN) injection 4 mg (4 mg Intravenous Given 09/15/23  1315)  HYDROmorphone (DILAUDID) injection 1 mg (1 mg Intravenous Given 09/15/23 1338)    FINAL CLINICAL IMPRESSION(S) / ED DIAGNOSES   Final diagnoses:  Kidney stone     Rx / DC Orders   ED Discharge Orders          Ordered    oxyCODONE-acetaminophen (PERCOCET)  5-325 MG tablet  Every 4 hours PRN        09/15/23 1531    tamsulosin (FLOMAX) 0.4 MG CAPS capsule  Daily        09/15/23 1531    ondansetron (ZOFRAN-ODT) 4 MG disintegrating tablet  Every 8 hours PRN        09/15/23 1531             Note:  This document was prepared using Dragon voice recognition software and [place] include unintentional dictation errors.   Viviano Ground, MD 09/15/23 [phone]

## 2023-09-15 NOTE — ED Triage Notes (Signed)
 Pt to ED for concern of kidney stone, L flank pain since yesterday morning. Hx of renal stone. No hematuria. Pain radiates into groin. Has vomited 4 times today when tried to take pain med. Pt appears to be in severe pain but handling it well.

## 2023-09-17 ENCOUNTER — Other Ambulatory Visit: Payer: Self-pay | Admitting: Urology

## 2023-09-17 ENCOUNTER — Inpatient Hospital Stay (HOSPITAL_COMMUNITY)

## 2023-09-17 ENCOUNTER — Other Ambulatory Visit: Payer: Self-pay

## 2023-09-17 ENCOUNTER — Encounter (HOSPITAL_COMMUNITY): Payer: Self-pay | Admitting: Urology

## 2023-09-17 ENCOUNTER — Encounter (HOSPITAL_COMMUNITY)
Admission: RE | Disposition: A | Discharge status: Home / Self Care | Payer: Self-pay | Source: Ambulatory Visit | Attending: Urology

## 2023-09-17 ENCOUNTER — Ambulatory Visit (HOSPITAL_COMMUNITY)
Admission: RE | Admit: 2023-09-17 | Discharge: 2023-09-17 | Disposition: A | Discharge status: Home / Self Care | Source: Ambulatory Visit | Attending: Urology | Admitting: Urology

## 2023-09-17 DIAGNOSIS — E119 Type 2 diabetes mellitus without complications: Secondary | ICD-10-CM | POA: Insufficient documentation

## 2023-09-17 DIAGNOSIS — Z87442 Personal history of urinary calculi: Secondary | ICD-10-CM | POA: Insufficient documentation

## 2023-09-17 DIAGNOSIS — N132 Hydronephrosis with renal and ureteral calculous obstruction: Secondary | ICD-10-CM | POA: Insufficient documentation

## 2023-09-17 DIAGNOSIS — Z87891 Personal history of nicotine dependence: Secondary | ICD-10-CM | POA: Diagnosis not present

## 2023-09-17 DIAGNOSIS — Z7984 Long term (current) use of oral hypoglycemic drugs: Secondary | ICD-10-CM | POA: Diagnosis not present

## 2023-09-17 HISTORY — PX: CYSTOSCOPY/URETEROSCOPY/HOLMIUM LASER/STENT PLACEMENT: SHX6546

## 2023-09-17 LAB — GLUCOSE, CAPILLARY
Glucose-Capillary: 179 mg/dL — ABNORMAL HIGH (ref 70–99)
Glucose-Capillary: 157 mg/dL — ABNORMAL HIGH (ref 70–99)
Glucose-Capillary: 176 mg/dL — ABNORMAL HIGH (ref 70–99)

## 2023-09-17 SURGERY — CYSTOSCOPY/URETEROSCOPY/HOLMIUM LASER/STENT PLACEMENT
Anesthesia: General | Laterality: Left

## 2023-09-17 MED ORDER — LIDOCAINE HCL (PF) 2 % IJ SOLN
INTRAMUSCULAR | Status: AC
  Filled 2023-09-17: qty 5

## 2023-09-17 MED ORDER — TRAMADOL HCL 50 MG PO TABS: 50.0000 mg | ORAL_TABLET | Freq: Four times a day (QID) | ORAL | 0 refills | Status: AC | PRN

## 2023-09-17 MED ORDER — ACETAMINOPHEN 10 MG/ML IV SOLN: 1000.0000 mg | Freq: Once | INTRAVENOUS | Status: DC | PRN

## 2023-09-17 MED ORDER — OXYCODONE HCL 5 MG/5ML PO SOLN: 5.0000 mg | Freq: Once | ORAL | Status: DC | PRN

## 2023-09-17 MED ORDER — PROPOFOL 10 MG/ML IV BOLUS
INTRAVENOUS | Status: AC
  Filled 2023-09-17: qty 20

## 2023-09-17 MED ORDER — HYOSCYAMINE SULFATE 0.125 MG SL SUBL: 0.1250 mg | SUBLINGUAL_TABLET | SUBLINGUAL | 0 refills | Status: AC | PRN

## 2023-09-17 MED ORDER — FENTANYL CITRATE PF 50 MCG/ML IJ SOSY: 25.0000 ug | PREFILLED_SYRINGE | INTRAMUSCULAR | Status: DC | PRN

## 2023-09-17 MED ORDER — LACTATED RINGERS IV SOLN: INTRAVENOUS | Status: DC

## 2023-09-17 MED ORDER — OXYCODONE HCL 5 MG PO TABS: 5.0000 mg | ORAL_TABLET | Freq: Once | ORAL | Status: DC | PRN

## 2023-09-17 MED ORDER — INSULIN ASPART 100 UNIT/ML IJ SOLN
0.0000 [IU] | INTRAMUSCULAR | Status: DC | PRN
  Administered 2023-09-17: 2 [IU] via SUBCUTANEOUS
  Filled 2023-09-17: qty 1

## 2023-09-17 MED ORDER — CIPROFLOXACIN IN D5W 400 MG/200ML IV SOLN
400.0000 mg | INTRAVENOUS | Status: AC
  Administered 2023-09-17: 400 mg via INTRAVENOUS
  Filled 2023-09-17: qty 200

## 2023-09-17 MED ORDER — LIDOCAINE HCL (CARDIAC) PF 100 MG/5ML IV SOSY
PREFILLED_SYRINGE | INTRAVENOUS | Status: DC | PRN
  Administered 2023-09-17: 60 mg via INTRAVENOUS

## 2023-09-17 MED ORDER — FENTANYL CITRATE (PF) 250 MCG/5ML IJ SOLN
INTRAMUSCULAR | Status: AC
Start: 2023-09-17 — End: ?
  Filled 2023-09-17: qty 5

## 2023-09-17 MED ORDER — MIDAZOLAM HCL 2 MG/2ML IJ SOLN
INTRAMUSCULAR | Status: AC
Start: 2023-09-17 — End: ?
  Filled 2023-09-17: qty 2

## 2023-09-17 MED ORDER — DEXMEDETOMIDINE HCL IN NACL 80 MCG/20ML IV SOLN
INTRAVENOUS | Status: DC | PRN
  Administered 2023-09-17 (×2): 4 ug via INTRAVENOUS

## 2023-09-17 MED ORDER — FENTANYL CITRATE PF 50 MCG/ML IJ SOSY
50.0000 ug | PREFILLED_SYRINGE | INTRAMUSCULAR | Status: AC
  Administered 2023-09-17: 50 ug via INTRAVENOUS
  Filled 2023-09-17: qty 1

## 2023-09-17 MED ORDER — CHLORHEXIDINE GLUCONATE 0.12 % MT SOLN
15.0000 mL | Freq: Once | OROMUCOSAL | Status: AC
  Administered 2023-09-17: 15 mL via OROMUCOSAL

## 2023-09-17 MED ORDER — AMISULPRIDE (ANTIEMETIC) 5 MG/2ML IV SOLN: 10.0000 mg | Freq: Once | INTRAVENOUS | Status: DC | PRN

## 2023-09-17 MED ORDER — DEXAMETHASONE SODIUM PHOSPHATE 10 MG/ML IJ SOLN
INTRAMUSCULAR | Status: DC | PRN
  Administered 2023-09-17: 10 mg via INTRAVENOUS

## 2023-09-17 MED ORDER — FENTANYL CITRATE (PF) 250 MCG/5ML IJ SOLN
INTRAMUSCULAR | Status: DC | PRN
  Administered 2023-09-17 (×5): 50 ug via INTRAVENOUS

## 2023-09-17 MED ORDER — DEXMEDETOMIDINE HCL IN NACL 80 MCG/20ML IV SOLN
INTRAVENOUS | Status: AC
  Filled 2023-09-17: qty 20

## 2023-09-17 MED ORDER — ONDANSETRON HCL 4 MG/2ML IJ SOLN
INTRAMUSCULAR | Status: DC | PRN
  Administered 2023-09-17: 4 mg via INTRAVENOUS

## 2023-09-17 MED ORDER — DEXAMETHASONE SODIUM PHOSPHATE 10 MG/ML IJ SOLN
INTRAMUSCULAR | Status: AC
  Filled 2023-09-17: qty 1

## 2023-09-17 MED ORDER — CELECOXIB 200 MG PO CAPS: 200.0000 mg | ORAL_CAPSULE | Freq: Two times a day (BID) | ORAL | 1 refills | Status: AC

## 2023-09-17 MED ORDER — ONDANSETRON HCL 4 MG/2ML IJ SOLN
INTRAMUSCULAR | Status: AC
  Filled 2023-09-17: qty 2

## 2023-09-17 MED ORDER — PROPOFOL 10 MG/ML IV BOLUS
INTRAVENOUS | Status: DC | PRN
  Administered 2023-09-17: 200 mg via INTRAVENOUS
  Administered 2023-09-17: 50 mg via INTRAVENOUS

## 2023-09-17 MED ORDER — IOHEXOL 300 MG/ML  SOLN
INTRAMUSCULAR | Status: DC | PRN
  Administered 2023-09-17: 26 mL

## 2023-09-17 MED ORDER — SODIUM CHLORIDE 0.9 % IR SOLN
Status: DC | PRN
  Administered 2023-09-17: 3000 mL

## 2023-09-17 MED ORDER — SULFAMETHOXAZOLE-TRIMETHOPRIM 800-160 MG PO TABS: 1.0000 | ORAL_TABLET | Freq: Two times a day (BID) | ORAL | 0 refills | Status: AC

## 2023-09-17 MED ORDER — ORAL CARE MOUTH RINSE: 15.0000 mL | Freq: Once | OROMUCOSAL | Status: AC

## 2023-09-17 MED ORDER — MIDAZOLAM HCL 5 MG/5ML IJ SOLN
INTRAMUSCULAR | Status: DC | PRN
  Administered 2023-09-17: 2 mg via INTRAVENOUS

## 2023-09-17 SURGICAL SUPPLY — 25 items
BAG COUNTER SPONGE SURGICOUNT (BAG) IMPLANT
BAG URO CATCHER STRL LF (MISCELLANEOUS) ×1 IMPLANT
BASKET ZERO TIP NITINOL 2.4FR (BASKET) IMPLANT
BENZOIN TINCTURE PRP APPL 2/3 (GAUZE/BANDAGES/DRESSINGS) IMPLANT
CATH ROBINSON RED A/P 14FR (CATHETERS) IMPLANT
CATH URETERAL DUAL LUMEN 10F (MISCELLANEOUS) ×1 IMPLANT
CATH URETL OPEN END 6FR 70 (CATHETERS) IMPLANT
CLOTH BEACON ORANGE TIMEOUT ST (SAFETY) ×1 IMPLANT
DRSG TEGADERM 2-3/8X2-3/4 SM (GAUZE/BANDAGES/DRESSINGS) IMPLANT
FIBER LASER FLEXIVA PULSE EA (MISCELLANEOUS) IMPLANT
GLOVE SS BIOGEL STRL SZ 7 (GLOVE) ×1 IMPLANT
GLOVE SURG LX STRL 7.5 STRW (GLOVE) ×1 IMPLANT
GOWN STRL REUS W/ TWL XL LVL3 (GOWN DISPOSABLE) ×1 IMPLANT
GUIDEWIRE STR DUAL SENSOR (WIRE) ×1 IMPLANT
GUIDEWIRE ZIPWRE .038 STRAIGHT (WIRE) ×1 IMPLANT
IV NS 1000ML BAXH (IV SOLUTION) ×1 IMPLANT
KIT TURNOVER KIT A (KITS) IMPLANT
LASER FIB FLEXIVA PULSE ID 365 (Laser) IMPLANT
MANIFOLD NEPTUNE II (INSTRUMENTS) ×1 IMPLANT
PACK CYSTO (CUSTOM PROCEDURE TRAY) ×1 IMPLANT
SHEATH NAVIGATOR HD 11/13X36 (SHEATH) IMPLANT
STENT URET 6FRX26 CONTOUR (STENTS) IMPLANT
TRACTIP FLEXIVA PULS ID 200XHI (Laser) IMPLANT
TUBING CONNECTING 10 (TUBING) ×1 IMPLANT
TUBING UROLOGY SET (TUBING) ×1 IMPLANT

## 2023-09-17 NOTE — Discharge Instructions (Addendum)
 [facility] [phone] Post Ureteroscopy With or Without Stent Instructions Remove stent by pulling on string Thursday morning  Definitions:  Ureter: The duct that transports urine from the kidney to the bladder. Stent:   A plastic hollow tube that is placed into the ureter, from the kidney to the bladder to prevent the ureter from swelling shut.  GENERAL INSTRUCTIONS:  Despite the fact that no skin incisions were used, the area around the ureter and bladder is raw and irritated. The stent is a foreign body which will further irritate the bladder wall. This irritation is manifested by increased frequency of urination, both day and night, and by an increase in the urge to urinate. In some, the urge to urinate is present almost always. Sometimes the urge is strong enough that you Tumblin not be able to stop yourself from urinating. The only real cure is to remove the stent and then give time for the bladder wall to heal which can't be done until the danger of the ureter swelling shut has passed, which varies.  You Fernholz see some blood in your urine while the stent is in place and a few days afterwards. Do not be alarmed, even if the urine was clear for a while. Get off your feet and drink lots of fluids until clearing occurs. If you start to pass clots or don't improve, call us .  DIET: You Vandevoorde return to your normal diet immediately. Because of the raw surface of your bladder, alcohol, spicy foods, acid type foods and drinks with caffeine Weltman cause irritation or frequency and should be used in moderation. To keep your urine flowing freely and to avoid constipation, drink plenty of fluids during the day ( 8-10 glasses ). Tip: Avoid cranberry juice because it is very acidic.  ACTIVITY: Your physical activity doesn't need to be restricted. However, if you are very active, you Eischen see some blood in your urine. We suggest that you reduce your activity under these circumstances until the  bleeding has stopped.  BOWELS: It is important to keep your bowels regular during the postoperative period. Straining with bowel movements can cause bleeding. A bowel movement every other day is reasonable. Use a mild laxative if needed, such as Milk of Magnesia 2-3 tablespoons, or 2 Dulcolax tablets. Call if you continue to have problems. If you have been taking narcotics for pain, before, during or after your surgery, you Florek be constipated. Take a laxative if necessary.   MEDICATION: You should resume your pre-surgery medications unless told not to. In addition you will often be given an antibiotic to prevent infection and likely several as needed medications for stent related discomfort. These should be taken as prescribed until the bottles are finished unless you are having an unusual reaction to one of the drugs.  PROBLEMS YOU SHOULD REPORT TO US : Fevers over 100.5 Fahrenheit. Heavy bleeding, or clots ( See above notes about blood in urine ). Inability to urinate. Drug reactions ( hives, rash, nausea, vomiting, diarrhea ). Severe burning or pain with urination that is not improving.

## 2023-09-17 NOTE — Transfer of Care (Signed)
 Immediate Anesthesia Transfer of Care Note  Patient: Joshua Fisher  Procedure(s) Performed: CYSTOSCOPY/URETEROSCOPY/HOLMIUM LASER/STENT PLACEMENT (Left)  Patient Location: PACU  Anesthesia Type:General  Level of Consciousness: drowsy  Airway & Oxygen Therapy: Patient Spontanous Breathing and Patient connected to face mask oxygen  Post-op Assessment: Report given to RN and Post -op Vital signs reviewed and stable  Post vital signs: Reviewed and stable  Last Vitals:  Vitals Value Taken Time  BP    Temp    Pulse    Resp    SpO2      Last Pain:  Vitals:   09/17/23 1509  PainSc: 10-Worst pain ever         Complications: No notable events documented.

## 2023-09-17 NOTE — Anesthesia Preprocedure Evaluation (Addendum)
 Anesthesia Evaluation  Patient identified by MRN, date of birth, ID band Patient awake    Reviewed: Allergy & Precautions, NPO status , Patient's Chart, lab work & pertinent test results  Airway Mallampati: II  TM Distance: >3 FB Neck ROM: Full    Dental no notable dental hx.    Pulmonary former smoker   Pulmonary exam normal        Cardiovascular negative cardio ROS Normal cardiovascular exam     Neuro/Psych negative neurological ROS  negative psych ROS   GI/Hepatic negative GI ROS, Neg liver ROS,,,  Endo/Other  diabetes, Poorly Controlled, Oral Hypoglycemic Agents  hyponatremia  Renal/GU Renal disease     Musculoskeletal negative musculoskeletal ROS (+)    Abdominal  (+) + obese  Peds  Hematology negative hematology ROS (+)   Anesthesia Other Findings LEFT URETERAL CALCULUS  Reproductive/Obstetrics                             Anesthesia Physical Anesthesia Plan  ASA: 2  Anesthesia Plan: General   Post-op Pain Management:    Induction: Intravenous  PONV Risk Score and Plan: 2 and Ondansetron, Dexamethasone, Midazolam and Treatment Laviolette vary due to age or medical condition  Airway Management Planned: LMA  Additional Equipment:   Intra-op Plan:   Post-operative Plan: Extubation in OR  Informed Consent: I have reviewed the patients History and Physical, chart, labs and discussed the procedure including the risks, benefits and alternatives for the proposed anesthesia with the patient or authorized representative who has indicated his/her understanding and acceptance.     Dental advisory given  Plan Discussed with: CRNA  Anesthesia Plan Comments:         Anesthesia Quick Evaluation

## 2023-09-17 NOTE — Op Note (Signed)
 Operative Note  Preoperative diagnosis:  1.  Left ureteral stone 2. Left hydronephrosis  Postoperative diagnosis: 1.  same  Procedure(s): 1.  Left ureteroscopy with laser lithotripsy of stones 2. Cystoscopy  3. Left retrograde pyelogram 4. Left ureteral stent placement 6x26cm 5. Fluoroscopy with intraoperative interpretation  Surgeon: Julene Oaks, MD  Assistants:  None  Anesthesia:  General  Complications:  None  EBL:  Minimal  Specimens: 1. None   Drains/Catheters: 1.  Left 6Fr x 26cm ureteral stent with tether string  Intraoperative findings:   Cystoscopy demonstrated unremarkable urethra Left Ureteroscopy demonstrated ureteral stone - this was pushed into the kidney and dusted. There were two other small fragments in a mid/lower pole calyx that were fragmented Successful stent placement.  Left Retrograde:  There was hydronephrosis in the proximal ureter and no filling defects  Description of procedure: After informed consent was obtained from the patient, the patient was identified and taken to the operating room and placed in the supine position.  General anesthesia was administered as well as perioperative IV antibiotics.  At the beginning of the case, a time-out was performed to properly identify the patient, the surgery to be performed, and the surgical site.  Sequential compression devices were applied to the lower extremities at the beginning of the case for DVT prophylaxis.  The patient was then placed in the dorsal lithotomy supine position, prepped and draped in sterile fashion.  We then passed the 21-French rigid cystoscope through the urethra and into the bladder under vision without any difficulty, noting a normal urethra without strictures.  A systematic evaluation of the bladder revealed no evidence of any suspicious bladder lesions.  Ureteral orifices were in normal position.     we then passed a 0.038 glide wire up to the level of the renal pelvis.  The  ureteral stent was then removed, leaving the glide wire up the left ureter.  The cystoscope was withdrawn, and a dual lumen catheter was inserted over the glide wire into the distal ureter. A gentle retrograde pyelogram was performed, revealing a normal caliber ureter without any filling defects. There was hydronephrosis in the proximal ureter and no filling defects. A 0.038 sensor wire was then passed up to the level of the renal pelvis and secured to the drape as a safety wire. The dual lumen was removed.  the flexible ureteroscope was advanced into the collecting system via the sensor wire. Calculi were identified as described above. Using the 242 micron holmium laser fiber, the stone was dusted completely. With the ureteroscope in the kidney, a gentle pyelogram was performed to delineate the calyceal system and we evaluated the calyces systematically. We encountered no further fragments greater than 2 mm. The rest of the stone fragments were very tiny and these were  irrigated away gently. The calyces were re-inspected and there were no significant stone fragment residual.   We then withdrew the ureteroscope back down the ureter noting no evidence of any stones along the course of the ureter.  Once the ureteroscope was removed, we then used the Glidewire under fluoroscopic guidance and passed up a 6-French x 26 cm double-pigtail ureteral stent up the ureter, making sure that the proximal and distal ends coiled within the kidney and bladder respectively.   Note that we left a tether string attached to the distal end of the ureteral stent and it exited the urethral meatus and was secured to the penile shaft with a tegaderm adhesive with a short tether.  The  cystoscope was then advanced back into the bladder under vision.  We were able to see the distal stent coiling nicely within the bladder.  The bladder was then emptied with irrigation solution.  The cystoscope was then removed.    The patient tolerated  the procedure well and there was no complication. Patient was awoken from anesthesia and taken to the recovery room in stable condition. I was present and scrubbed for the entirety of the case.  Plan:  Patient will be discharged home and Cogar remove stent in 3 days   G. Julene Oaks MD [facility]  Pager: [phone]

## 2023-09-17 NOTE — Anesthesia Procedure Notes (Signed)
 Procedure Name: LMA Insertion Date/Time: 09/17/2023 4:14 PM  Performed by: Elaina Graver, CRNAPre-anesthesia Checklist: Patient identified, Emergency Drugs available, Suction available and Patient being monitored Patient Re-evaluated:Patient Re-evaluated prior to induction Oxygen Delivery Method: Circle System Utilized Preoxygenation: Pre-oxygenation with 100% oxygen Induction Type: IV induction Ventilation: Mask ventilation without difficulty LMA: LMA inserted LMA Size: 5.0 Number of attempts: 1 Placement Confirmation: positive ETCO2 Tube secured with: Tape Dental Injury: Teeth and Oropharynx as per pre-operative assessment

## 2023-09-17 NOTE — H&P (Signed)
 H&P  History of Present Illness: Joshua Fisher is a 43 y.o. year old M who presents today for treatment of left ureteral and possibly left renal stones. He was seen in the ED over the weekend for acute left flank pain at which time the stones were identified on CT scan.  He continues to endorse marked left flank pain   Past Medical History:  Diagnosis Date   Diabetes mellitus without complication (HCC)    DM type 2   History of kidney stones     Past Surgical History:  Procedure Laterality Date   none      Home Medications:  No outpatient medications have been marked as taking for the 09/17/23 encounter [facility] Encounter).    Allergies: No Known Allergies  Family History  Problem Relation Age of Onset   Bladder Cancer Neg Hx    Kidney cancer Neg Hx    Prostate cancer Neg Hx     Social History:  reports that he has quit smoking. He does not have any smokeless tobacco history on file. He reports that he does not drink alcohol and does not use drugs.  ROS: A complete review of systems was performed.  All systems are negative except for pertinent findings as noted.  Physical Exam:  Vital signs in last 24 hours:   Constitutional:  Alert and oriented, No acute distress Cardiovascular: Regular rate and rhythm Respiratory: Normal respiratory effort, Lungs clear bilaterally GI: Abdomen is soft, nontender, nondistended, no abdominal masses Lymphatic: No lymphadenopathy Neurologic: Grossly intact, no focal deficits Psychiatric: Normal mood and affect   Laboratory Data:  Recent Labs    09/15/23 1237  WBC 11.1*  HGB 14.5  HCT 42.4  PLT 205    Recent Labs    09/15/23 1237  NA 129*  K 4.3  CL 95*  GLUCOSE 266*  BUN 17  CALCIUM 9.4  CREATININE 0.97     No results found for this or any previous visit (from the past 24 hours). No results found for this or any previous visit (from the past 240 hours).  Renal Function: Recent Labs    09/15/23 1237  CREATININE  0.97   Estimated Creatinine Clearance: 141.2 mL/min (by C-G formula based on SCr of 0.97 mg/dL).  Radiologic Imaging: CT Renal Stone Study Result Date: 09/15/2023 CLINICAL DATA:  [facility]-CTAb[facility]/flank pain, stone suspected L flank EXAM: CT ABDOMEN AND PELVIS WITHOUT CONTRAST TECHNIQUE: Multidetector CT imaging of the abdomen and pelvis was performed following the standard protocol without IV contrast. RADIATION DOSE REDUCTION: This exam was performed according to the departmental dose-optimization program which includes automated exposure control, adjustment of the mA and/or kV according to patient size and/or use of iterative reconstruction technique. COMPARISON:  None Available. FINDINGS: Lower chest: Lung bases are clear. Hepatobiliary: No focal hepatic lesion. Normal gallbladder. No biliary duct dilatation. Common bile duct is normal. Pancreas: Pancreas is normal. No ductal dilatation. No pancreatic inflammation. Spleen: Normal spleen Adrenals/urinary tract: Adrenal gland normal. Obstructing calculus in the proximal LEFT ureter at the ureteral pelvic junction measuring 6 mm on image 55/2. There is mild pelvicaliectasis. There is stranding in the LEFT renal hilum. Additionally bilateral renal calculi.  No bladder calculi. Stomach/Bowel: Stomach, small bowel, appendix, and cecum are normal. The colon and rectosigmoid colon are normal. Vascular/Lymphatic: Abdominal aorta is normal caliber. No periportal or retroperitoneal adenopathy. No pelvic adenopathy. Reproductive: Prostate unremarkable Other: No free fluid. Musculoskeletal: No aggressive osseous lesion. IMPRESSION: 1. Obstructing calculus in the proximal LEFT  ureter at the ureteropelvic junction. Mild pelvicaliectasis. 2. Bilateral renal calculi. Electronically Signed   By: Deboraha Fallow M.D.   On: 09/15/2023 14:05    Assessment:  Joshua Fisher is a 43 y.o. year old M with Obstructing left ureteral stone, nonobstructing left renal stones  Plan:   --to OR as planned for left ureteroscopy with laser litho, stent. Procedure and risks reviewed, including but not limited to hematuria, infection, sepsis, damage to GU tract, failure to complete procedure, retained stone fragments, need for future procedures, stent pain, prolonged stent.   Julene Oaks, MD 09/17/2023, 12:58 PM  [facility] Pager: [phone]

## 2023-09-18 ENCOUNTER — Encounter (HOSPITAL_COMMUNITY): Payer: Self-pay | Admitting: Urology

## 2023-09-26 NOTE — Anesthesia Postprocedure Evaluation (Signed)
 Anesthesia Post Note  Patient: Joshua Fisher  Procedure(s) Performed: CYSTOSCOPY/URETEROSCOPY/HOLMIUM LASER/STENT PLACEMENT (Left)     Patient location during evaluation: PACU Anesthesia Type: General Level of consciousness: awake Pain management: pain level controlled Vital Signs Assessment: post-procedure vital signs reviewed and stable Respiratory status: spontaneous breathing, nonlabored ventilation and respiratory function stable Cardiovascular status: blood pressure returned to baseline and stable Postop Assessment: no apparent nausea or vomiting Anesthetic complications: no   No notable events documented.  Last Vitals:  Vitals:   09/17/23 1800 09/17/23 1830  BP: (!) 141/87 (!) 167/99  Pulse: 95 (!) 101  Resp: 15 18  Temp:  36.9 C  SpO2: 90% 93%    Last Pain:  Vitals:   09/17/23 1830  PainSc: 0-No pain                 Lashannon Bresnan P Concetta Guion
